# Patient Record
Sex: Female | Born: 1945 | Race: White | Hispanic: No | Marital: Married
Health system: Southern US, Community
[De-identification: ages and names within clinical notes are randomized; demographics above are authoritative.]

## PROBLEM LIST (undated history)

## (undated) DIAGNOSIS — E785 Hyperlipidemia, unspecified: Secondary | ICD-10-CM

## (undated) DIAGNOSIS — E119 Type 2 diabetes mellitus without complications: Secondary | ICD-10-CM

## (undated) HISTORY — DX: Hyperlipidemia, unspecified: E78.5

## (undated) HISTORY — DX: Type 2 diabetes mellitus without complications: E11.9

---

## 2010-08-21 ENCOUNTER — Encounter: Payer: Self-pay | Admitting: Family Medicine

## 2010-08-21 ENCOUNTER — Ambulatory Visit
Admission: RE | Admit: 2010-08-21 | Discharge: 2010-08-21 | Payer: Self-pay | Source: Home / Self Care | Admitting: Family Medicine

## 2010-08-21 DIAGNOSIS — M7520 Bicipital tendinitis, unspecified shoulder: Secondary | ICD-10-CM | POA: Insufficient documentation

## 2010-08-21 DIAGNOSIS — S239XXA Sprain of unspecified parts of thorax, initial encounter: Secondary | ICD-10-CM

## 2010-08-21 DIAGNOSIS — M7710 Lateral epicondylitis, unspecified elbow: Secondary | ICD-10-CM | POA: Insufficient documentation

## 2010-08-23 ENCOUNTER — Telehealth (INDEPENDENT_AMBULATORY_CARE_PROVIDER_SITE_OTHER): Payer: Self-pay | Admitting: *Deleted

## 2010-08-27 ENCOUNTER — Telehealth: Payer: Self-pay | Admitting: Emergency Medicine
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
## 2010-09-25 NOTE — Letter (Signed)
Summary: External Correspondence  External Correspondence   Imported By: Dannette Barbara 08/21/2010 14:26:25  _____________________________________________________________________  External Attachment:    Type:   Image     Comment:   External Document

## 2010-09-25 NOTE — Progress Notes (Signed)
  Phone Note Call from Patient   Caller: Patient Summary of Call: Patient called states she is still having pain and wants a refill for pain medication called into [facility] [phone]. If you need to speak with her you can call her [phone].  Initial call taken by: Dannette Barbara,  August 27, 2010 11:54 AM

## 2010-09-25 NOTE — Progress Notes (Signed)
  Phone Note Outgoing Call   Call placed by: Clemens Catholic LPN,  August 23, 2010 1:09 PM Call placed to: Patient Summary of Call: call back: called to F/U with pt. left message to call back if any questions or concerns. Initial call taken by: Clemens Catholic LPN,  August 23, 2010 1:10 PM

## 2010-10-17 ENCOUNTER — Other Ambulatory Visit: Payer: Self-pay | Admitting: Family Medicine

## 2010-10-17 DIAGNOSIS — M545 Low back pain: Secondary | ICD-10-CM

## 2010-10-24 ENCOUNTER — Other Ambulatory Visit: Payer: Self-pay

## 2010-11-06 ENCOUNTER — Other Ambulatory Visit: Payer: Self-pay | Admitting: Family Medicine

## 2010-11-06 DIAGNOSIS — M545 Low back pain: Secondary | ICD-10-CM

## 2010-11-09 ENCOUNTER — Ambulatory Visit
Admission: RE | Admit: 2010-11-09 | Discharge: 2010-11-09 | Disposition: A | Discharge status: Home / Self Care | Payer: MEDICARE | Source: Ambulatory Visit | Attending: Family Medicine | Admitting: Family Medicine

## 2010-11-09 DIAGNOSIS — M545 Low back pain: Secondary | ICD-10-CM

## 2010-12-03 ENCOUNTER — Ambulatory Visit (HOSPITAL_COMMUNITY)
Admission: RE | Admit: 2010-12-03 | Discharge: 2010-12-03 | Disposition: A | Discharge status: Home / Self Care | Payer: Medicare Other | Source: Ambulatory Visit | Attending: Neurological Surgery | Admitting: Neurological Surgery

## 2010-12-03 ENCOUNTER — Encounter (HOSPITAL_COMMUNITY)
Admission: RE | Admit: 2010-12-03 | Discharge: 2010-12-03 | Disposition: A | Discharge status: Home / Self Care | Payer: Medicare Other | Source: Ambulatory Visit | Attending: Neurological Surgery | Admitting: Neurological Surgery

## 2010-12-03 ENCOUNTER — Other Ambulatory Visit (HOSPITAL_COMMUNITY): Payer: Self-pay | Admitting: Neurological Surgery

## 2010-12-03 DIAGNOSIS — Z01812 Encounter for preprocedural laboratory examination: Secondary | ICD-10-CM | POA: Insufficient documentation

## 2010-12-03 DIAGNOSIS — Z01818 Encounter for other preprocedural examination: Secondary | ICD-10-CM | POA: Insufficient documentation

## 2010-12-03 DIAGNOSIS — Z01811 Encounter for preprocedural respiratory examination: Secondary | ICD-10-CM

## 2010-12-03 DIAGNOSIS — Z01810 Encounter for preprocedural cardiovascular examination: Secondary | ICD-10-CM | POA: Insufficient documentation

## 2010-12-03 DIAGNOSIS — R05 Cough: Secondary | ICD-10-CM | POA: Insufficient documentation

## 2010-12-03 DIAGNOSIS — R059 Cough, unspecified: Secondary | ICD-10-CM | POA: Insufficient documentation

## 2010-12-03 LAB — DIFFERENTIAL
Basophils Absolute: 0.1 10*3/uL (ref 0.0–0.1)
Basophils Relative: 1 % (ref 0–1)
Monocytes Absolute: 0.4 10*3/uL (ref 0.1–1.0)
Neutro Abs: 1.5 10*3/uL — ABNORMAL LOW (ref 1.7–7.7)

## 2010-12-03 LAB — BASIC METABOLIC PANEL
BUN: 8 mg/dL (ref 6–23)
Chloride: 106 mEq/L (ref 96–112)
Creatinine, Ser: 0.72 mg/dL (ref 0.4–1.2)

## 2010-12-03 LAB — SURGICAL PCR SCREEN: Staphylococcus aureus: NEGATIVE

## 2010-12-03 LAB — CBC
Hemoglobin: 13.6 g/dL (ref 12.0–15.0)
MCHC: 34 g/dL (ref 30.0–36.0)
RDW: 12.7 % (ref 11.5–15.5)
WBC: 4.5 10*3/uL (ref 4.0–10.5)

## 2010-12-03 LAB — APTT: aPTT: 37 seconds (ref 24–37)

## 2010-12-03 LAB — PROTIME-INR: Prothrombin Time: 12.5 seconds (ref 11.6–15.2)

## 2010-12-11 ENCOUNTER — Ambulatory Visit (HOSPITAL_COMMUNITY)
Admission: RE | Admit: 2010-12-11 | Discharge: 2010-12-11 | Disposition: A | Discharge status: Home / Self Care | Payer: Medicare Other | Source: Ambulatory Visit | Attending: Neurological Surgery | Admitting: Neurological Surgery

## 2010-12-11 DIAGNOSIS — M479 Spondylosis, unspecified: Secondary | ICD-10-CM | POA: Insufficient documentation

## 2010-12-24 ENCOUNTER — Ambulatory Visit: Payer: Medicare Other | Attending: Neurological Surgery | Admitting: Physical Therapy

## 2010-12-24 DIAGNOSIS — M545 Low back pain, unspecified: Secondary | ICD-10-CM | POA: Insufficient documentation

## 2010-12-24 DIAGNOSIS — M25559 Pain in unspecified hip: Secondary | ICD-10-CM | POA: Insufficient documentation

## 2010-12-24 DIAGNOSIS — M6281 Muscle weakness (generalized): Secondary | ICD-10-CM | POA: Insufficient documentation

## 2010-12-24 DIAGNOSIS — IMO0001 Reserved for inherently not codable concepts without codable children: Secondary | ICD-10-CM | POA: Insufficient documentation

## 2010-12-26 ENCOUNTER — Ambulatory Visit: Payer: Medicare Other | Admitting: Physical Therapy

## 2010-12-29 ENCOUNTER — Ambulatory Visit: Payer: Medicare Other | Admitting: Physical Therapy

## 2011-01-01 ENCOUNTER — Ambulatory Visit: Payer: Medicare Other

## 2011-01-05 ENCOUNTER — Ambulatory Visit: Payer: Medicare Other | Admitting: Physical Therapy

## 2011-01-08 ENCOUNTER — Ambulatory Visit: Payer: Medicare Other | Admitting: Physical Therapy

## 2011-01-12 ENCOUNTER — Ambulatory Visit: Payer: Medicare Other | Admitting: Physical Therapy

## 2011-01-15 ENCOUNTER — Ambulatory Visit: Payer: Medicare Other | Admitting: Physical Therapy

## 2014-12-11 ENCOUNTER — Ambulatory Visit (INDEPENDENT_AMBULATORY_CARE_PROVIDER_SITE_OTHER): Payer: Medicare Other

## 2014-12-11 ENCOUNTER — Other Ambulatory Visit: Payer: Self-pay | Admitting: Neurological Surgery

## 2014-12-11 DIAGNOSIS — M47892 Other spondylosis, cervical region: Secondary | ICD-10-CM | POA: Diagnosis not present

## 2014-12-11 DIAGNOSIS — M5032 Other cervical disc degeneration, mid-cervical region: Secondary | ICD-10-CM | POA: Diagnosis not present

## 2014-12-11 DIAGNOSIS — M5412 Radiculopathy, cervical region: Secondary | ICD-10-CM
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
## 2016-07-28 ENCOUNTER — Other Ambulatory Visit: Payer: Self-pay | Admitting: Family Medicine

## 2016-07-28 ENCOUNTER — Ambulatory Visit
Admission: RE | Admit: 2016-07-28 | Discharge: 2016-07-28 | Disposition: A | Discharge status: Home / Self Care | Payer: Medicare Other | Source: Ambulatory Visit | Attending: Family Medicine | Admitting: Family Medicine

## 2016-07-28 DIAGNOSIS — R0789 Other chest pain: Secondary | ICD-10-CM

## 2016-09-15 ENCOUNTER — Encounter: Payer: Medicare Other | Attending: Family Medicine | Admitting: Dietician

## 2016-09-15 DIAGNOSIS — E119 Type 2 diabetes mellitus without complications: Secondary | ICD-10-CM

## 2016-09-15 DIAGNOSIS — Z713 Dietary counseling and surveillance: Secondary | ICD-10-CM | POA: Diagnosis not present

## 2016-09-18 ENCOUNTER — Encounter: Payer: Self-pay | Admitting: Dietician

## 2016-09-18 NOTE — Progress Notes (Signed)

## 2016-09-22 ENCOUNTER — Encounter: Payer: Medicare Other | Admitting: Dietician

## 2016-09-22 DIAGNOSIS — E119 Type 2 diabetes mellitus without complications: Secondary | ICD-10-CM | POA: Diagnosis not present

## 2016-09-22 NOTE — Progress Notes (Signed)

## 2016-09-29 ENCOUNTER — Encounter: Payer: Medicare Other | Attending: Family Medicine | Admitting: Skilled Nursing Facility1

## 2016-09-29 ENCOUNTER — Encounter: Payer: Self-pay | Admitting: Skilled Nursing Facility1

## 2016-09-29 DIAGNOSIS — Z713 Dietary counseling and surveillance: Secondary | ICD-10-CM | POA: Diagnosis not present

## 2016-09-29 DIAGNOSIS — E119 Type 2 diabetes mellitus without complications: Secondary | ICD-10-CM | POA: Insufficient documentation

## 2016-09-29 NOTE — Progress Notes (Signed)
Patient was seen on 09/29/2016 for the third of a series of three diabetes self-management courses at the [facility]. The following learning objectives were met by the patient during this class:  . State the amount of activity recommended for healthy living . Describe activities suitable for individual needs . Identify ways to regularly incorporate activity into daily life . Identify barriers to activity and ways to over come these barriers  Identify diabetes medications being personally used and their primary action for lowering glucose and possible side effects . Describe role of stress on blood glucose and develop strategies to address psychosocial issues . Identify diabetes complications and ways to prevent them  Explain how to manage diabetes during illness . Evaluate success in meeting personal goal . Establish 2-3 goals that they will plan to diligently work on until they return for the  74-monthfollow-up visit  Goals:   I will count my carb choices at most meals and snacks  I will be active 30 minutes or more 5 times a week  I will test my glucose at least 2 times a day, 7 days a week  Your patient has identified these potential barriers to change:  Motivation Your patient has identified their diabetes self-care support plan as  Family Support On-line Resources Plan:  Attend Monthly Diabetes Support Group as needed or make a future follow up appointment

## 2019-12-07 ENCOUNTER — Other Ambulatory Visit: Payer: Self-pay | Admitting: Family Medicine

## 2019-12-07 DIAGNOSIS — M8580 Other specified disorders of bone density and structure, unspecified site: Secondary | ICD-10-CM

## 2020-05-24 ENCOUNTER — Other Ambulatory Visit: Payer: Self-pay

## 2020-05-24 ENCOUNTER — Ambulatory Visit
Admission: RE | Admit: 2020-05-24 | Discharge: 2020-05-24 | Disposition: A | Discharge status: Home / Self Care | Payer: Medicare Other | Source: Ambulatory Visit | Attending: Family Medicine | Admitting: Family Medicine

## 2020-05-24 DIAGNOSIS — M8580 Other specified disorders of bone density and structure, unspecified site: Secondary | ICD-10-CM

## 2020-12-17 DIAGNOSIS — E039 Hypothyroidism, unspecified: Secondary | ICD-10-CM | POA: Diagnosis not present

## 2020-12-17 DIAGNOSIS — E1169 Type 2 diabetes mellitus with other specified complication: Secondary | ICD-10-CM | POA: Diagnosis not present

## 2020-12-17 DIAGNOSIS — M21612 Bunion of left foot: Secondary | ICD-10-CM | POA: Diagnosis not present

## 2020-12-17 DIAGNOSIS — R197 Diarrhea, unspecified: Secondary | ICD-10-CM | POA: Diagnosis not present

## 2020-12-17 DIAGNOSIS — E785 Hyperlipidemia, unspecified: Secondary | ICD-10-CM | POA: Diagnosis not present

## 2020-12-17 DIAGNOSIS — Z136 Encounter for screening for cardiovascular disorders: Secondary | ICD-10-CM | POA: Diagnosis not present

## 2020-12-17 DIAGNOSIS — Z0000 Encounter for general adult medical examination without abnormal findings: Secondary | ICD-10-CM | POA: Diagnosis not present

## 2020-12-17 DIAGNOSIS — E559 Vitamin D deficiency, unspecified: Secondary | ICD-10-CM | POA: Diagnosis not present

## 2020-12-17 DIAGNOSIS — Z79899 Other long term (current) drug therapy: Secondary | ICD-10-CM | POA: Diagnosis not present

## 2020-12-17 DIAGNOSIS — K219 Gastro-esophageal reflux disease without esophagitis: Secondary | ICD-10-CM | POA: Diagnosis not present

## 2020-12-17 DIAGNOSIS — M810 Age-related osteoporosis without current pathological fracture: Secondary | ICD-10-CM | POA: Diagnosis not present

## 2020-12-17 DIAGNOSIS — Z23 Encounter for immunization: Secondary | ICD-10-CM | POA: Diagnosis not present

## 2021-03-31 DIAGNOSIS — H18832 Recurrent erosion of cornea, left eye: Secondary | ICD-10-CM | POA: Diagnosis not present

## 2021-03-31 DIAGNOSIS — H527 Unspecified disorder of refraction: Secondary | ICD-10-CM | POA: Diagnosis not present

## 2021-03-31 DIAGNOSIS — H25813 Combined forms of age-related cataract, bilateral: Secondary | ICD-10-CM | POA: Diagnosis not present

## 2021-03-31 DIAGNOSIS — H0100B Unspecified blepharitis left eye, upper and lower eyelids: Secondary | ICD-10-CM | POA: Diagnosis not present

## 2021-03-31 DIAGNOSIS — H43813 Vitreous degeneration, bilateral: Secondary | ICD-10-CM | POA: Diagnosis not present

## 2021-03-31 DIAGNOSIS — H02831 Dermatochalasis of right upper eyelid: Secondary | ICD-10-CM | POA: Diagnosis not present

## 2021-03-31 DIAGNOSIS — H02834 Dermatochalasis of left upper eyelid: Secondary | ICD-10-CM | POA: Diagnosis not present

## 2021-03-31 DIAGNOSIS — H0100A Unspecified blepharitis right eye, upper and lower eyelids: Secondary | ICD-10-CM | POA: Diagnosis not present

## 2021-03-31 DIAGNOSIS — E119 Type 2 diabetes mellitus without complications: Secondary | ICD-10-CM | POA: Diagnosis not present

## 2021-06-18 DIAGNOSIS — R030 Elevated blood-pressure reading, without diagnosis of hypertension: Secondary | ICD-10-CM | POA: Diagnosis not present

## 2021-06-18 DIAGNOSIS — R197 Diarrhea, unspecified: Secondary | ICD-10-CM | POA: Diagnosis not present

## 2021-06-18 DIAGNOSIS — Z23 Encounter for immunization: Secondary | ICD-10-CM | POA: Diagnosis not present

## 2021-06-18 DIAGNOSIS — E1169 Type 2 diabetes mellitus with other specified complication: Secondary | ICD-10-CM | POA: Diagnosis not present

## 2021-06-18 DIAGNOSIS — E785 Hyperlipidemia, unspecified: Secondary | ICD-10-CM | POA: Diagnosis not present

## 2021-06-18 DIAGNOSIS — M810 Age-related osteoporosis without current pathological fracture: Secondary | ICD-10-CM | POA: Diagnosis not present

## 2021-09-02 DIAGNOSIS — U071 COVID-19: Secondary | ICD-10-CM | POA: Diagnosis not present

## 2021-09-02 DIAGNOSIS — R509 Fever, unspecified: Secondary | ICD-10-CM | POA: Diagnosis not present

## 2021-09-02 DIAGNOSIS — R52 Pain, unspecified: Secondary | ICD-10-CM | POA: Diagnosis not present

## 2021-09-02 DIAGNOSIS — R059 Cough, unspecified: Secondary | ICD-10-CM | POA: Diagnosis not present

## 2022-01-26 DIAGNOSIS — Z86010 Personal history of colonic polyps: Secondary | ICD-10-CM | POA: Diagnosis not present

## 2022-01-26 DIAGNOSIS — K219 Gastro-esophageal reflux disease without esophagitis: Secondary | ICD-10-CM | POA: Diagnosis not present

## 2022-01-26 DIAGNOSIS — E785 Hyperlipidemia, unspecified: Secondary | ICD-10-CM | POA: Diagnosis not present

## 2022-01-26 DIAGNOSIS — E559 Vitamin D deficiency, unspecified: Secondary | ICD-10-CM | POA: Diagnosis not present

## 2022-01-26 DIAGNOSIS — E039 Hypothyroidism, unspecified: Secondary | ICD-10-CM | POA: Diagnosis not present

## 2022-01-26 DIAGNOSIS — Z79899 Other long term (current) drug therapy: Secondary | ICD-10-CM | POA: Diagnosis not present

## 2022-01-26 DIAGNOSIS — Z1211 Encounter for screening for malignant neoplasm of colon: Secondary | ICD-10-CM | POA: Diagnosis not present

## 2022-01-26 DIAGNOSIS — M810 Age-related osteoporosis without current pathological fracture: Secondary | ICD-10-CM | POA: Diagnosis not present

## 2022-01-26 DIAGNOSIS — E1169 Type 2 diabetes mellitus with other specified complication: Secondary | ICD-10-CM | POA: Diagnosis not present

## 2022-01-26 DIAGNOSIS — Z0000 Encounter for general adult medical examination without abnormal findings: Secondary | ICD-10-CM | POA: Diagnosis not present

## 2022-07-24 DIAGNOSIS — E1169 Type 2 diabetes mellitus with other specified complication: Secondary | ICD-10-CM | POA: Diagnosis not present

## 2022-07-24 DIAGNOSIS — E785 Hyperlipidemia, unspecified: Secondary | ICD-10-CM | POA: Diagnosis not present

## 2022-07-24 DIAGNOSIS — Z23 Encounter for immunization: Secondary | ICD-10-CM | POA: Diagnosis not present

## 2022-07-24 DIAGNOSIS — R059 Cough, unspecified: Secondary | ICD-10-CM | POA: Diagnosis not present

## 2022-07-24 DIAGNOSIS — E039 Hypothyroidism, unspecified: Secondary | ICD-10-CM | POA: Diagnosis not present

## 2023-01-12 DIAGNOSIS — R6889 Other general symptoms and signs: Secondary | ICD-10-CM | POA: Diagnosis not present

## 2023-03-15 ENCOUNTER — Other Ambulatory Visit: Payer: Self-pay | Admitting: Family Medicine

## 2023-03-15 DIAGNOSIS — M810 Age-related osteoporosis without current pathological fracture: Secondary | ICD-10-CM

## 2023-03-15 DIAGNOSIS — E1169 Type 2 diabetes mellitus with other specified complication: Secondary | ICD-10-CM | POA: Diagnosis not present

## 2023-03-15 DIAGNOSIS — R030 Elevated blood-pressure reading, without diagnosis of hypertension: Secondary | ICD-10-CM | POA: Diagnosis not present

## 2023-03-15 DIAGNOSIS — E559 Vitamin D deficiency, unspecified: Secondary | ICD-10-CM | POA: Diagnosis not present

## 2023-03-15 DIAGNOSIS — E119 Type 2 diabetes mellitus without complications: Secondary | ICD-10-CM | POA: Diagnosis not present

## 2023-03-15 DIAGNOSIS — Z79899 Other long term (current) drug therapy: Secondary | ICD-10-CM | POA: Diagnosis not present

## 2023-03-15 DIAGNOSIS — K219 Gastro-esophageal reflux disease without esophagitis: Secondary | ICD-10-CM | POA: Diagnosis not present

## 2023-03-15 DIAGNOSIS — E785 Hyperlipidemia, unspecified: Secondary | ICD-10-CM | POA: Diagnosis not present

## 2023-03-15 DIAGNOSIS — E039 Hypothyroidism, unspecified: Secondary | ICD-10-CM | POA: Diagnosis not present

## 2023-03-15 DIAGNOSIS — Z0000 Encounter for general adult medical examination without abnormal findings: Secondary | ICD-10-CM | POA: Diagnosis not present

## 2023-03-15 DIAGNOSIS — Z86010 Personal history of colonic polyps: Secondary | ICD-10-CM | POA: Diagnosis not present

## 2023-06-10 DIAGNOSIS — Z23 Encounter for immunization: Secondary | ICD-10-CM | POA: Diagnosis not present

## 2023-09-06 DIAGNOSIS — L821 Other seborrheic keratosis: Secondary | ICD-10-CM | POA: Diagnosis not present

## 2023-09-06 DIAGNOSIS — L814 Other melanin hyperpigmentation: Secondary | ICD-10-CM | POA: Diagnosis not present

## 2023-12-06 DIAGNOSIS — B351 Tinea unguium: Secondary | ICD-10-CM | POA: Diagnosis not present

## 2023-12-06 DIAGNOSIS — G5761 Lesion of plantar nerve, right lower limb: Secondary | ICD-10-CM | POA: Diagnosis not present

## 2023-12-06 DIAGNOSIS — I70203 Unspecified atherosclerosis of native arteries of extremities, bilateral legs: Secondary | ICD-10-CM | POA: Diagnosis not present

## 2023-12-06 DIAGNOSIS — M2011 Hallux valgus (acquired), right foot: Secondary | ICD-10-CM | POA: Diagnosis not present

## 2023-12-16 ENCOUNTER — Ambulatory Visit
Admission: RE | Admit: 2023-12-16 | Discharge: 2023-12-16 | Disposition: A | Discharge status: Home / Self Care | Payer: Medicare Other | Source: Ambulatory Visit | Attending: Family Medicine | Admitting: Family Medicine

## 2023-12-16 DIAGNOSIS — M810 Age-related osteoporosis without current pathological fracture: Secondary | ICD-10-CM

## 2023-12-22 DIAGNOSIS — I70203 Unspecified atherosclerosis of native arteries of extremities, bilateral legs: Secondary | ICD-10-CM | POA: Diagnosis not present

## 2023-12-22 DIAGNOSIS — G5761 Lesion of plantar nerve, right lower limb: Secondary | ICD-10-CM | POA: Diagnosis not present

## 2024-04-27 DIAGNOSIS — M810 Age-related osteoporosis without current pathological fracture: Secondary | ICD-10-CM | POA: Diagnosis not present

## 2024-04-27 DIAGNOSIS — Z86018 Personal history of other benign neoplasm: Secondary | ICD-10-CM | POA: Diagnosis not present

## 2024-04-27 DIAGNOSIS — E785 Hyperlipidemia, unspecified: Secondary | ICD-10-CM | POA: Diagnosis not present

## 2024-04-27 DIAGNOSIS — E039 Hypothyroidism, unspecified: Secondary | ICD-10-CM | POA: Diagnosis not present

## 2024-04-27 DIAGNOSIS — K219 Gastro-esophageal reflux disease without esophagitis: Secondary | ICD-10-CM | POA: Diagnosis not present

## 2024-04-27 DIAGNOSIS — E1169 Type 2 diabetes mellitus with other specified complication: Secondary | ICD-10-CM | POA: Diagnosis not present

## 2024-04-27 DIAGNOSIS — L570 Actinic keratosis: Secondary | ICD-10-CM | POA: Diagnosis not present

## 2024-04-27 DIAGNOSIS — E559 Vitamin D deficiency, unspecified: Secondary | ICD-10-CM | POA: Diagnosis not present

## 2024-04-27 DIAGNOSIS — Z79899 Other long term (current) drug therapy: Secondary | ICD-10-CM | POA: Diagnosis not present

## 2024-04-27 DIAGNOSIS — R030 Elevated blood-pressure reading, without diagnosis of hypertension: Secondary | ICD-10-CM | POA: Diagnosis not present

## 2024-04-27 DIAGNOSIS — Z0000 Encounter for general adult medical examination without abnormal findings: Secondary | ICD-10-CM | POA: Diagnosis not present

## 2024-06-08 DIAGNOSIS — Z23 Encounter for immunization: Secondary | ICD-10-CM | POA: Diagnosis not present

## 2024-06-15 DIAGNOSIS — H35373 Puckering of macula, bilateral: Secondary | ICD-10-CM | POA: Diagnosis not present

## 2024-06-15 DIAGNOSIS — H02834 Dermatochalasis of left upper eyelid: Secondary | ICD-10-CM | POA: Diagnosis not present

## 2024-06-15 DIAGNOSIS — H02831 Dermatochalasis of right upper eyelid: Secondary | ICD-10-CM | POA: Diagnosis not present

## 2024-06-15 DIAGNOSIS — H527 Unspecified disorder of refraction: Secondary | ICD-10-CM | POA: Diagnosis not present

## 2024-06-15 DIAGNOSIS — H18832 Recurrent erosion of cornea, left eye: Secondary | ICD-10-CM | POA: Diagnosis not present

## 2024-06-15 DIAGNOSIS — H43813 Vitreous degeneration, bilateral: Secondary | ICD-10-CM | POA: Diagnosis not present

## 2024-06-15 DIAGNOSIS — H25813 Combined forms of age-related cataract, bilateral: Secondary | ICD-10-CM | POA: Diagnosis not present

## 2024-06-15 DIAGNOSIS — E119 Type 2 diabetes mellitus without complications: Secondary | ICD-10-CM | POA: Diagnosis not present
